# Patient Record
Sex: Male | Born: 1975 | Race: White | Hispanic: No | Marital: Married | Smoking: Former smoker
Health system: Southern US, Community
[De-identification: ages and names within clinical notes are randomized; demographics above are authoritative.]

---

## 2014-03-06 ENCOUNTER — Emergency Department
Admission: EM | Admit: 2014-03-06 | Discharge: 2014-03-06 | Discharge status: Absent without leave | Payer: Commercial Managed Care - PPO | Source: Home / Self Care

## 2014-03-06 MED ORDER — INFLUENZA VAC SPLIT QUAD 0.5 ML IM SUSY
0.5000 mL | PREFILLED_SYRINGE | Freq: Once | INTRAMUSCULAR | Status: AC
  Administered 2014-03-06: 0.5 mL via INTRAMUSCULAR

## 2014-03-06 NOTE — ED Notes (Signed)
Patient changed mind, left before administered

## 2014-03-07 ENCOUNTER — Encounter: Payer: Self-pay | Admitting: *Deleted

## 2014-03-07 ENCOUNTER — Emergency Department
Admission: EM | Admit: 2014-03-07 | Discharge: 2014-03-07 | Disposition: A | Discharge status: Home / Self Care | Payer: Commercial Managed Care - PPO | Source: Home / Self Care

## 2014-03-07 DIAGNOSIS — Z23 Encounter for immunization: Secondary | ICD-10-CM

## 2014-03-07 MED ORDER — INFLUENZA VAC SPLIT QUAD 0.5 ML IM SUSY
0.5000 mL | PREFILLED_SYRINGE | Freq: Once | INTRAMUSCULAR | Status: AC
  Administered 2014-03-07: 0.5 mL via INTRAMUSCULAR

## 2014-03-07 NOTE — ED Notes (Signed)
Flu vac to left deltiod

## 2019-09-04 ENCOUNTER — Emergency Department (INDEPENDENT_AMBULATORY_CARE_PROVIDER_SITE_OTHER): Payer: 59

## 2019-09-04 ENCOUNTER — Emergency Department
Admission: EM | Admit: 2019-09-04 | Discharge: 2019-09-04 | Disposition: A | Discharge status: Home / Self Care | Payer: 59 | Source: Home / Self Care

## 2019-09-04 ENCOUNTER — Other Ambulatory Visit: Payer: Self-pay

## 2019-09-04 DIAGNOSIS — M7711 Lateral epicondylitis, right elbow: Secondary | ICD-10-CM | POA: Diagnosis not present

## 2019-09-04 DIAGNOSIS — M25521 Pain in right elbow: Secondary | ICD-10-CM

## 2019-09-04 MED ORDER — MELOXICAM 15 MG PO TABS: 15.0000 mg | ORAL_TABLET | Freq: Every day | ORAL | 0 refills | Status: DC

## 2019-09-04 MED ORDER — ACETAMINOPHEN 325 MG PO TABS
650.0000 mg | ORAL_TABLET | Freq: Once | ORAL | Status: AC
  Administered 2019-09-04: 650 mg via ORAL

## 2019-09-04 NOTE — ED Triage Notes (Signed)
Patient presents to Urgent Care with complaints of right elbow pain that has gotten progressively worse since about 4 weeks ago. Patient reports no known injury, has been applying ice at night, pain is dull and radiates down his arm towards his hand.

## 2019-09-04 NOTE — ED Provider Notes (Signed)
[facility]    CSN: [id number] Arrival date & time: 09/04/19  1441      History   Chief Complaint Chief Complaint  Patient presents with  . Elbow Pain    HPI Harold Thomas is a 44 y.o. male.   HPI  Harold Thomas is a 44 y.o. male presenting to UC with c/o Right elbow pain that started about 4 weeks ago, intermittent, sharp and aching with certain movements. He has applied ice and taken Aleve at times. 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 He is Right hand dominant. Pain is 7/10 at worst.    History reviewed. No pertinent past medical history.  There are no problems to display for this patient.   History reviewed. No pertinent surgical history.     Home Medications    Prior to Admission medications   Medication Sig Start Date End Date Taking? Authorizing Provider  meloxicam (MOBIC) 15 MG tablet Take 1 tablet (15 mg total) by mouth daily. 09/04/19   Lurene Shadow, PA-C    Family History Family History  Problem Relation Age of Onset  . Cancer Mother   . Diabetes Father   . Cancer Father     Social History Social History   Tobacco Use  . Smoking status: Former Games developer  . Smokeless tobacco: Never Used  Substance Use Topics  . Alcohol use: Yes    Comment: occ  . Drug use: No     Allergies   Penicillins   Review of Systems Review of Systems  Musculoskeletal: Positive for arthralgias. Negative for back pain, joint swelling and neck pain.  Skin: Negative for color change and wound.  Neurological: Positive for weakness. Negative for numbness.     Physical Exam Triage Vital Signs ED Triage Vitals  Enc Vitals Group     BP 09/04/19 1453 124/82     Pulse Rate 09/04/19 1453 95     Resp 09/04/19 1453 18     Temp 09/04/19 1453 99.4 F (37.4 C)     Temp Source 09/04/19 1453 Oral     SpO2 09/04/19 1453 96 %     Weight --      Height --      Head Circumference --      Peak Flow --      Pain Score  09/04/19 1452 7     Pain Loc --      Pain Edu? --      Excl. in GC? --    No data found.  Updated Vital Signs BP 124/82 (BP Location: Left Arm)   Pulse 95   Temp 99.4 F (37.4 C) (Oral)   Resp 18   SpO2 96%   Visual Acuity Right Eye Distance:   Left Eye Distance:   Bilateral Distance:    Right Eye Near:   Left Eye Near:    Bilateral Near:     Physical Exam Vitals and nursing note reviewed.  Constitutional:      Appearance: Normal appearance. He is well-developed.  HENT:     Head: Normocephalic and atraumatic.  Cardiovascular:     Rate and Rhythm: Normal rate and regular rhythm.     Pulses:          Radial pulses are 2+ on the right side.  Pulmonary:     Effort: Pulmonary effort is normal.  Musculoskeletal:        General: Tenderness present. No swelling.  Normal range of motion.     Cervical back: Normal range of motion.     Comments: Right elbow: tenderness along lateral aspect. Full ROM. Increased pain with pronation and supination against resistance. 4/5 grip strength compared to Left. No tenderness of shoulder, wrist or hand.  No spinal tenderness.   Skin:    General: Skin is warm and dry.     Capillary Refill: Capillary refill takes less than 2 seconds.     Findings: No bruising or erythema.  Neurological:     Mental Status: He is alert and oriented to person, place, and time.     Sensory: No sensory deficit.  Psychiatric:        Behavior: Behavior normal.      UC Treatments / Results  Labs (all labs ordered are listed, but only abnormal results are displayed) Labs Reviewed - No data to display  EKG   Radiology DG ELBOW COMPLETE RIGHT (3+VIEW)  Result Date: 09/04/2019 CLINICAL DATA:  Elbow pain for 4 weeks EXAM: RIGHT ELBOW - COMPLETE 3+ VIEW COMPARISON:  None. FINDINGS: There is no evidence of fracture, dislocation, or joint effusion. There is no evidence of arthropathy or other focal bone abnormality. Soft tissues are unremarkable. IMPRESSION:  Negative. Electronically Signed   By: Jasmine Pang M.D.   On: 09/04/2019 15:44    Procedures Procedures (including critical care time)  Medications Ordered in UC Medications  acetaminophen (TYLENOL) tablet 650 mg (650 mg Oral Given 09/04/19 1458)    Initial Impression / Assessment and Plan / UC Course  I have reviewed the triage vital signs and the nursing notes.  Pertinent labs & imaging results that were available during my care of the patient were reviewed by me and considered in my medical decision making (see chart for details).     Reviewed imaging with pt Will tx with elbow strap for lateral epicondylitis Rx: meloxicam  F/u Sports Medicine AVS given  Final Clinical Impressions(s) / UC Diagnoses   Final diagnoses:  Right elbow pain  Right lateral epicondylitis     Discharge Instructions      Meloxicam (Mobic) is an antiinflammatory to help with pain and inflammation.  Do not take ibuprofen, Advil, Aleve, or any other medications that contain NSAIDs while taking meloxicam as this may cause stomach upset or even ulcers if taken in large amounts for an extended period of time.   Please call to schedule a follow up appointment with Sports Medicine if not improving in 1 week.    ED Prescriptions    Medication Sig Dispense Auth. Provider   meloxicam (MOBIC) 15 MG tablet Take 1 tablet (15 mg total) by mouth daily. 30 tablet Lurene Shadow, [place]     PDMP not reviewed this encounter.   Lurene Shadow, [place] 09/04/19 1652

## 2019-09-04 NOTE — Discharge Instructions (Signed)
°  Meloxicam (Mobic) is an antiinflammatory to help with pain and inflammation.  Do not take ibuprofen, Advil, Aleve, or any other medications that contain NSAIDs while taking meloxicam as this may cause stomach upset or even ulcers if taken in large amounts for an extended period of time.   Please call to schedule a follow up appointment with Sports Medicine if not improving in 1 week.

## 2020-03-30 ENCOUNTER — Encounter: Payer: Self-pay | Admitting: Emergency Medicine

## 2020-03-30 ENCOUNTER — Emergency Department
Admission: EM | Admit: 2020-03-30 | Discharge: 2020-03-30 | Disposition: A | Discharge status: Home / Self Care | Payer: 59 | Source: Home / Self Care

## 2020-03-30 ENCOUNTER — Other Ambulatory Visit: Payer: Self-pay

## 2020-03-30 DIAGNOSIS — R053 Chronic cough: Secondary | ICD-10-CM

## 2020-03-30 DIAGNOSIS — R0982 Postnasal drip: Secondary | ICD-10-CM

## 2020-03-30 DIAGNOSIS — J069 Acute upper respiratory infection, unspecified: Secondary | ICD-10-CM

## 2020-03-30 MED ORDER — BENZONATATE 100 MG PO CAPS: 100.0000 mg | ORAL_CAPSULE | Freq: Three times a day (TID) | ORAL | 0 refills | Status: DC

## 2020-03-30 MED ORDER — AZITHROMYCIN 250 MG PO TABS: 250.0000 mg | ORAL_TABLET | Freq: Every day | ORAL | 0 refills | Status: DC

## 2020-03-30 MED ORDER — IPRATROPIUM BROMIDE 0.06 % NA SOLN: 2.0000 | Freq: Four times a day (QID) | NASAL | 1 refills | Status: DC

## 2020-03-30 NOTE — Discharge Instructions (Signed)
  Please take antibiotics as prescribed and be sure to complete entire course even if you start to feel better to ensure infection does not come back.  Call to schedule a follow up appointment with primary care if not improving in 1 week.

## 2020-03-30 NOTE — ED Provider Notes (Signed)
[facility]    CSN: [id number] Arrival date & time: 03/30/20  1210      History   Chief Complaint Chief Complaint  Patient presents with  . Cough    HPI Harold Thomas is a 45 y.o. male.   HPI  Harold Thomas is a 45 y.o. male presenting to UC with c/o continued dry cough after being dx with COVID 5-6 weeks ago.  Associated nasal congestion and drainage at night. Denies fever, chills, n/v/d. No chest pain or SOB.    History reviewed. No pertinent past medical history.  There are no problems to display for this patient.   History reviewed. No pertinent surgical history.     Home Medications    Prior to Admission medications   Medication Sig Start Date End Date Taking? Authorizing Provider  azithromycin (ZITHROMAX) 250 MG tablet Take 1 tablet (250 mg total) by mouth daily. Take first 2 tablets together, then 1 every day until finished. 03/30/20  Yes Dyami Umbach O, PA-C  benzonatate (TESSALON) 100 MG capsule Take 1 capsule (100 mg total) by mouth every 8 (eight) hours. 03/30/20  Yes Leola Fiore O, PA-C  ipratropium (ATROVENT) 0.06 % nasal spray Place 2 sprays into both nostrils 4 (four) times daily. 03/30/20  Yes Delilah Mulgrew, Vangie Bicker, PA-C  meloxicam (MOBIC) 15 MG tablet Take 1 tablet (15 mg total) by mouth daily. 09/04/19   Lurene Shadow, PA-C    Family History Family History  Problem Relation Age of Onset  . Cancer Mother   . Diabetes Father   . Cancer Father     Social History Social History   Tobacco Use  . Smoking status: Former Games developer  . Smokeless tobacco: Never Used  Substance Use Topics  . Alcohol use: Yes    Comment: occ  . Drug use: No     Allergies   Penicillins   Review of Systems Review of Systems  Constitutional: Negative for chills and fever.  HENT: Positive for congestion, postnasal drip and sinus pressure. Negative for ear pain, sore throat, trouble swallowing and voice change.   Respiratory: Positive for cough. Negative for  shortness of breath.   Cardiovascular: Negative for chest pain and palpitations.  Gastrointestinal: Negative for abdominal pain, diarrhea, nausea and vomiting.  Musculoskeletal: Negative for arthralgias, back pain and myalgias.  Skin: Negative for rash.  Neurological: Positive for headaches. Negative for dizziness and light-headedness.  All other systems reviewed and are negative.    Physical Exam Triage Vital Signs ED Triage Vitals  Enc Vitals Group     BP 03/30/20 1235 (!) 160/89     Pulse Rate 03/30/20 1235 90     Resp --      Temp 03/30/20 1235 99 F (37.2 C)     Temp Source 03/30/20 1235 Oral     SpO2 03/30/20 1235 96 %     Weight --      Height --      Head Circumference --      Peak Flow --      Pain Score 03/30/20 1236 0     Pain Loc --      Pain Edu? --      Excl. in GC? --    No data found.  Updated Vital Signs BP (!) 160/89 (BP Location: Left Arm)   Pulse 90   Temp 99 F (37.2 C) (Oral)   SpO2 96%   Visual Acuity Right Eye Distance:   Left Eye Distance:  Bilateral Distance:    Right Eye Near:   Left Eye Near:    Bilateral Near:     Physical Exam Vitals and nursing note reviewed.  Constitutional:      General: He is not in acute distress.    Appearance: Normal appearance. He is well-developed and well-nourished. He is not ill-appearing, toxic-appearing or diaphoretic.  HENT:     Head: Normocephalic and atraumatic.     Right Ear: Tympanic membrane and ear canal normal.     Left Ear: Tympanic membrane and ear canal normal.     Nose: Congestion present.     Right Sinus: No maxillary sinus tenderness or frontal sinus tenderness.     Left Sinus: No maxillary sinus tenderness or frontal sinus tenderness.     Mouth/Throat:     Lips: Pink.     Mouth: Mucous membranes are moist.     Pharynx: Oropharynx is clear. Uvula midline. No pharyngeal swelling, oropharyngeal exudate, posterior oropharyngeal erythema or uvula swelling.  Eyes:     General: No  scleral icterus.    Conjunctiva/sclera: Conjunctivae normal.  Cardiovascular:     Rate and Rhythm: Normal rate and regular rhythm.     Heart sounds: Normal heart sounds.  Pulmonary:     Effort: Pulmonary effort is normal. No respiratory distress.     Breath sounds: Normal breath sounds. No stridor. No wheezing, rhonchi or rales.  Chest:     Chest wall: No tenderness.  Abdominal:     General: Bowel sounds are normal. There is no distension.     Palpations: Abdomen is soft. There is no mass.     Tenderness: There is no abdominal tenderness. There is no guarding or rebound.  Musculoskeletal:        General: Normal range of motion.     Cervical back: Normal range of motion and neck supple. No tenderness.  Lymphadenopathy:     Cervical: No cervical adenopathy.  Skin:    General: Skin is warm and dry.  Neurological:     Mental Status: He is alert.      UC Treatments / Results  Labs (all labs ordered are listed, but only abnormal results are displayed) Labs Reviewed - No data to display  EKG   Radiology No results found.  Procedures Procedures (including critical care time)  Medications Ordered in UC Medications - No data to display  Initial Impression / Assessment and Plan / UC Course  I have reviewed the triage vital signs and the nursing notes.  Pertinent labs & imaging results that were available during my care of the patient were reviewed by me and considered in my medical decision making (see chart for details).    Lungs: CTAB, however, due to persistence of symptoms, will cover for atypical bacterial infection Encouraged f/u with PCP as needed  Final Clinical Impressions(s) / UC Diagnoses   Final diagnoses:  Upper respiratory tract infection, unspecified type  Persistent cough for 3 weeks or longer  Post-nasal drainage     Discharge Instructions      Please take antibiotics as prescribed and be sure to complete entire course even if you start to feel  better to ensure infection does not come back.  Call to schedule a follow up appointment with primary care if not improving in 1 week.    ED Prescriptions    Medication Sig Dispense Auth. Provider   azithromycin (ZITHROMAX) 250 MG tablet Take 1 tablet (250 mg total) by mouth daily. Take first  2 tablets together, then 1 every day until finished. 6 tablet Waylan Rocher O, PA-C   ipratropium (ATROVENT) 0.06 % nasal spray Place 2 sprays into both nostrils 4 (four) times daily. 15 mL Niurka Benecke O, PA-C   benzonatate (TESSALON) 100 MG capsule Take 1 capsule (100 mg total) by mouth every 8 (eight) hours. 21 capsule Lurene Shadow, [place]     PDMP not reviewed this encounter.   Lurene Shadow, [place] 03/31/20 1337

## 2020-03-30 NOTE — ED Triage Notes (Signed)
Patient was diagnosed with COVID 5-6 weeks ago, still having a dry cough, head congestion, drainage at night.  No cough at night.  Patient has taken Delsym, Advil sinus.  Patient is vaccinated.

## 2021-02-11 ENCOUNTER — Other Ambulatory Visit: Payer: Self-pay

## 2021-02-11 ENCOUNTER — Encounter: Payer: Self-pay | Admitting: Emergency Medicine

## 2021-02-11 ENCOUNTER — Emergency Department (INDEPENDENT_AMBULATORY_CARE_PROVIDER_SITE_OTHER)
Admission: EM | Admit: 2021-02-11 | Discharge: 2021-02-11 | Disposition: A | Discharge status: Home / Self Care | Payer: 59 | Source: Home / Self Care | Attending: Family Medicine | Admitting: Family Medicine

## 2021-02-11 DIAGNOSIS — R051 Acute cough: Secondary | ICD-10-CM | POA: Diagnosis not present

## 2021-02-11 DIAGNOSIS — J101 Influenza due to other identified influenza virus with other respiratory manifestations: Secondary | ICD-10-CM

## 2021-02-11 LAB — POCT INFLUENZA A/B
Influenza A, POC: POSITIVE — AB
Influenza B, POC: NEGATIVE

## 2021-02-11 MED ORDER — PROMETHAZINE-DM 6.25-15 MG/5ML PO SYRP: 5.0000 mL | ORAL_SOLUTION | Freq: Four times a day (QID) | ORAL | 0 refills | Status: AC | PRN

## 2021-02-11 MED ORDER — OSELTAMIVIR PHOSPHATE 75 MG PO CAPS: 75.0000 mg | ORAL_CAPSULE | Freq: Two times a day (BID) | ORAL | 0 refills | Status: AC

## 2021-02-11 NOTE — ED Triage Notes (Signed)
Cough x 2 days  Nasal congestion  Body aches Sudafed & dayquil  No flu vaccine

## 2021-02-11 NOTE — ED Provider Notes (Signed)
[facility]    CSN: [id number] Arrival date & time: 02/11/21  0913      History   Chief Complaint Chief Complaint  Patient presents with   Cough    HPI Harold Thomas is a 45 y.o. male.   HPI  Patient has a sudden onset of headaches, body aches, nasal congestion, harsh cough with chest pain.  Feels very tired.  He did not get a flu vaccine.  He has been using DayQuil and Sudafed.  He thinks he may have the flu.  No known exposure to influenza, COVID, strep.  Symptoms have been persisting for 2 days  History reviewed. No pertinent past medical history.  There are no problems to display for this patient.   History reviewed. No pertinent surgical history.     Home Medications    Prior to Admission medications   Medication Sig Start Date End Date Taking? Authorizing Provider  oseltamivir (TAMIFLU) 75 MG capsule Take 1 capsule (75 mg total) by mouth every 12 (twelve) hours. 02/11/21  Yes Eustace Moore, MD  promethazine-dextromethorphan (PROMETHAZINE-DM) 6.25-15 MG/5ML syrup Take 5 mLs by mouth 4 (four) times daily as needed for cough. 02/11/21  Yes Eustace Moore, MD    Family History Family History  Problem Relation Age of Onset   Cancer Mother    Diabetes Father    Cancer Father     Social History Social History   Tobacco Use   Smoking status: Former   Smokeless tobacco: Never  Building services engineer Use: Never used  Substance Use Topics   Alcohol use: Yes    Comment: occ   Drug use: No     Allergies   Penicillins   Review of Systems Review of Systems  See HPI Physical Exam Triage Vital Signs ED Triage Vitals  Enc Vitals Group     BP 02/11/21 1022 (!) 164/106     Pulse Rate 02/11/21 1022 87     Resp 02/11/21 1022 18     Temp 02/11/21 1022 99.3 F (37.4 C)     Temp Source 02/11/21 1022 Oral     SpO2 02/11/21 1022 99 %     Weight 02/11/21 1010 270 lb (122.5 kg)     Height 02/11/21 1010 6\' 3"  (1.905 m)     Head  Circumference --      Peak Flow --      Pain Score 02/11/21 1009 2     Pain Loc --      Pain Edu? --      Excl. in GC? --    No data found.  Updated Vital Signs BP 136/84 (BP Location: Left Arm)   Pulse 87   Temp 99.3 F (37.4 C) (Oral)   Resp 18   Ht 6\' 3"  (1.905 m)   Wt 122.5 kg   SpO2 99%   BMI 33.75 kg/m      Physical Exam Constitutional:      General: He is not in acute distress.    Appearance: He is well-developed and normal weight. He is ill-appearing.  HENT:     Head: Normocephalic and atraumatic.     Right Ear: Tympanic membrane and ear canal normal.     Left Ear: Tympanic membrane and ear canal normal.     Nose: Congestion present.     Mouth/Throat:     Pharynx: Posterior oropharyngeal erythema present.  Eyes:     Conjunctiva/sclera: Conjunctivae normal.     Pupils:  Pupils are equal, round, and reactive to light.  Cardiovascular:     Rate and Rhythm: Normal rate and regular rhythm.     Heart sounds: Normal heart sounds.  Pulmonary:     Effort: Pulmonary effort is normal. No respiratory distress.     Breath sounds: No wheezing or rhonchi.  Abdominal:     General: There is no distension.     Palpations: Abdomen is soft.  Musculoskeletal:        General: Normal range of motion.     Cervical back: Normal range of motion.  Lymphadenopathy:     Cervical: Cervical adenopathy present.  Skin:    General: Skin is warm and dry.  Neurological:     Mental Status: He is alert.  Psychiatric:        Mood and Affect: Mood normal.        Behavior: Behavior normal.     UC Treatments / Results  Labs (all labs ordered are listed, but only abnormal results are displayed) Labs Reviewed  POCT INFLUENZA A/B - Abnormal; Notable for the following components:      Result Value   Influenza A, POC Positive (*)    All other components within normal limits    EKG   Radiology No results found.  Procedures Procedures (including critical care time)  Medications  Ordered in UC Medications - No data to display  Initial Impression / Assessment and Plan / UC Course  I have reviewed the triage vital signs and the nursing notes.  Pertinent labs & imaging results that were available during my care of the patient were reviewed by me and considered in my medical decision making (see chart for details).      Influenza test is positive.  Discussed care.  Patient desires Tamiflu.  His wife is a Engineer, civil (consulting).  Offered Zofran.  He states he is not having nausea.  Phenergan DM for cough  Final diagnoses:  Acute cough  Influenza A     Discharge Instructions      Drink lots of fluids  take over-the-counter cough and cold medicines as needed Take ibuprofen or acetaminophen for pain and fever  Take Tamiflu 2 times a day for 5 days.  Take 2 doses today.  Call for problems   ED Prescriptions     Medication Sig Dispense Auth. Provider   oseltamivir (TAMIFLU) 75 MG capsule Take 1 capsule (75 mg total) by mouth every 12 (twelve) hours. 10 capsule Eustace Moore, MD   promethazine-dextromethorphan (PROMETHAZINE-DM) 6.25-15 MG/5ML syrup Take 5 mLs by mouth 4 (four) times daily as needed for cough. 180 mL Eustace Moore, MD      PDMP not reviewed this encounter.   Eustace Moore, MD 02/11/21 [id number]

## 2021-02-11 NOTE — Discharge Instructions (Addendum)
Drink lots of fluids  take over-the-counter cough and cold medicines as needed Take ibuprofen or acetaminophen for pain and fever  Take Tamiflu 2 times a day for 5 days.  Take 2 doses today.  Call for problems

## 2021-12-17 IMAGING — DX DG ELBOW COMPLETE 3+V*R*
4 series · 4 of 4 positions shown · non-contrast
Comparison: None.

CLINICAL DATA: Elbow pain for 4 weeks

EXAM:
RIGHT ELBOW - COMPLETE 3+ VIEW

[elbow ap]
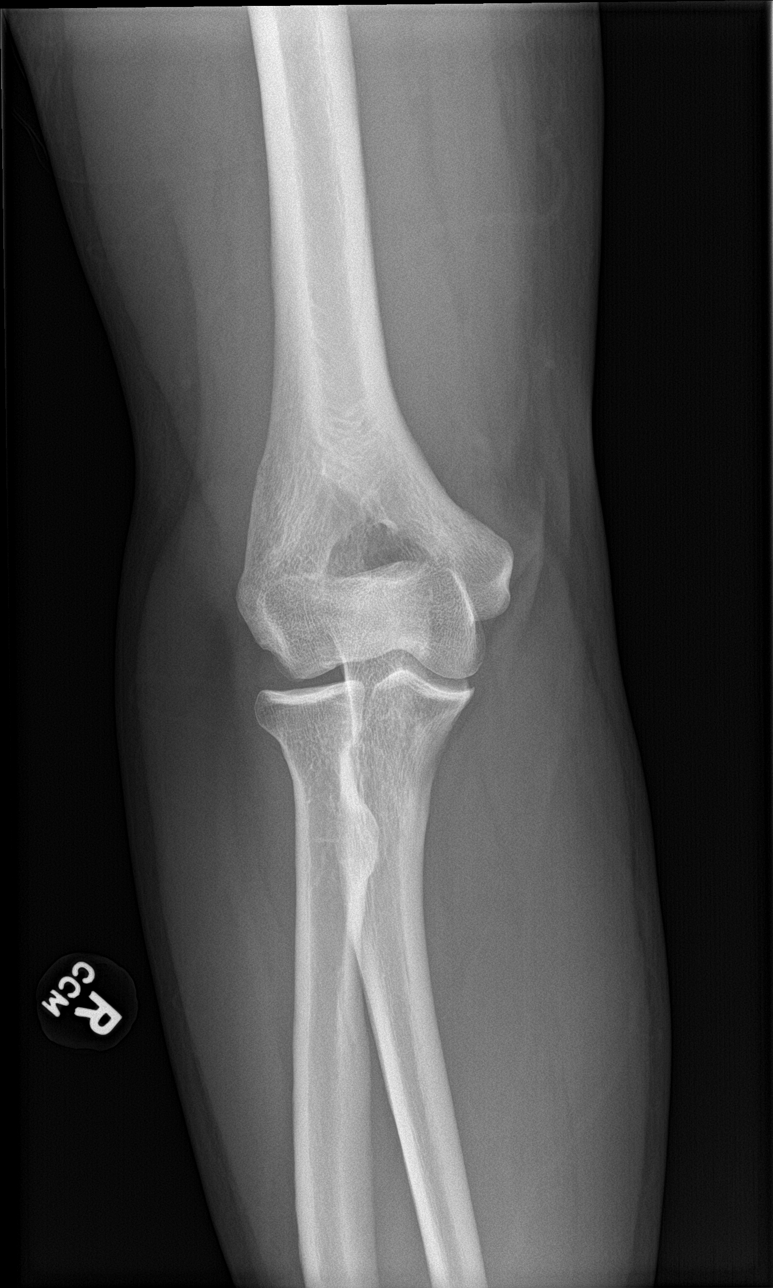

[elbow obl (1 of 2)]
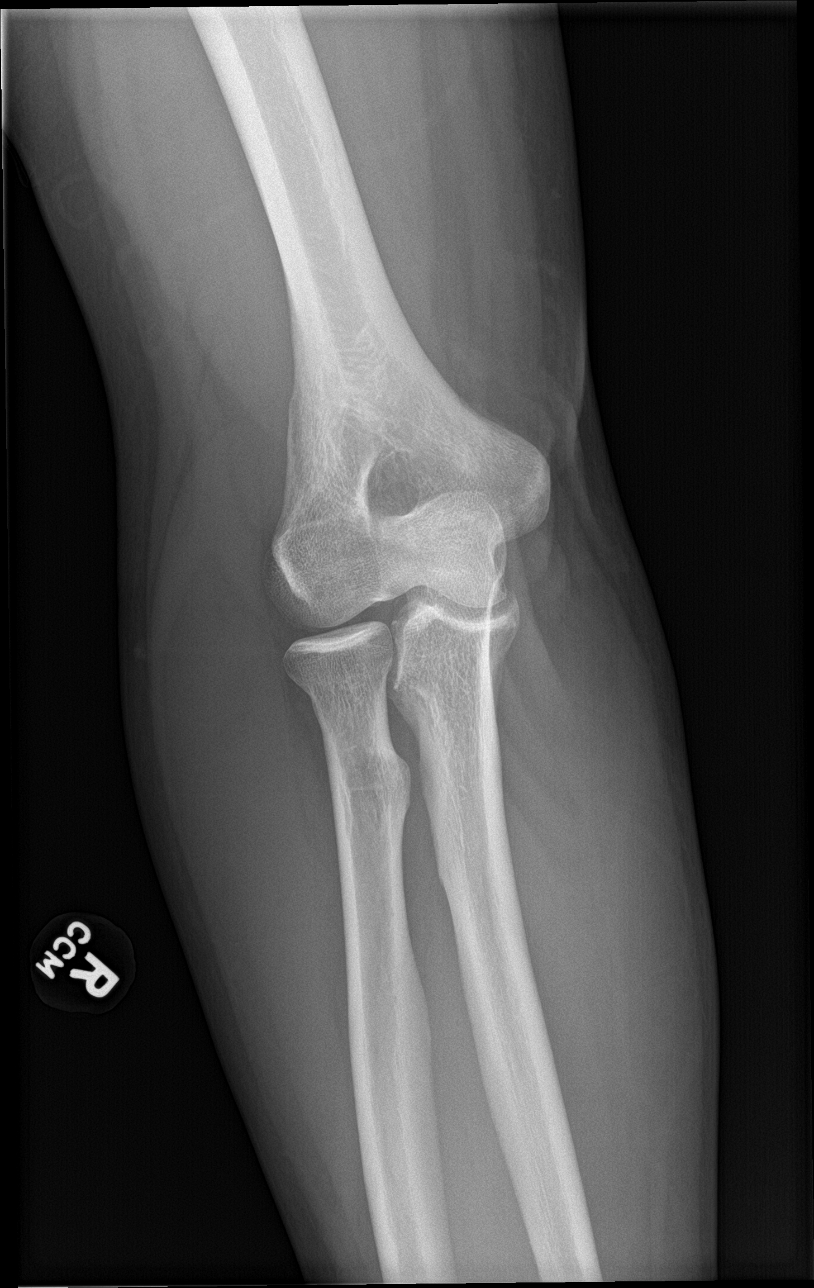

[elbow obl (2 of 2)]
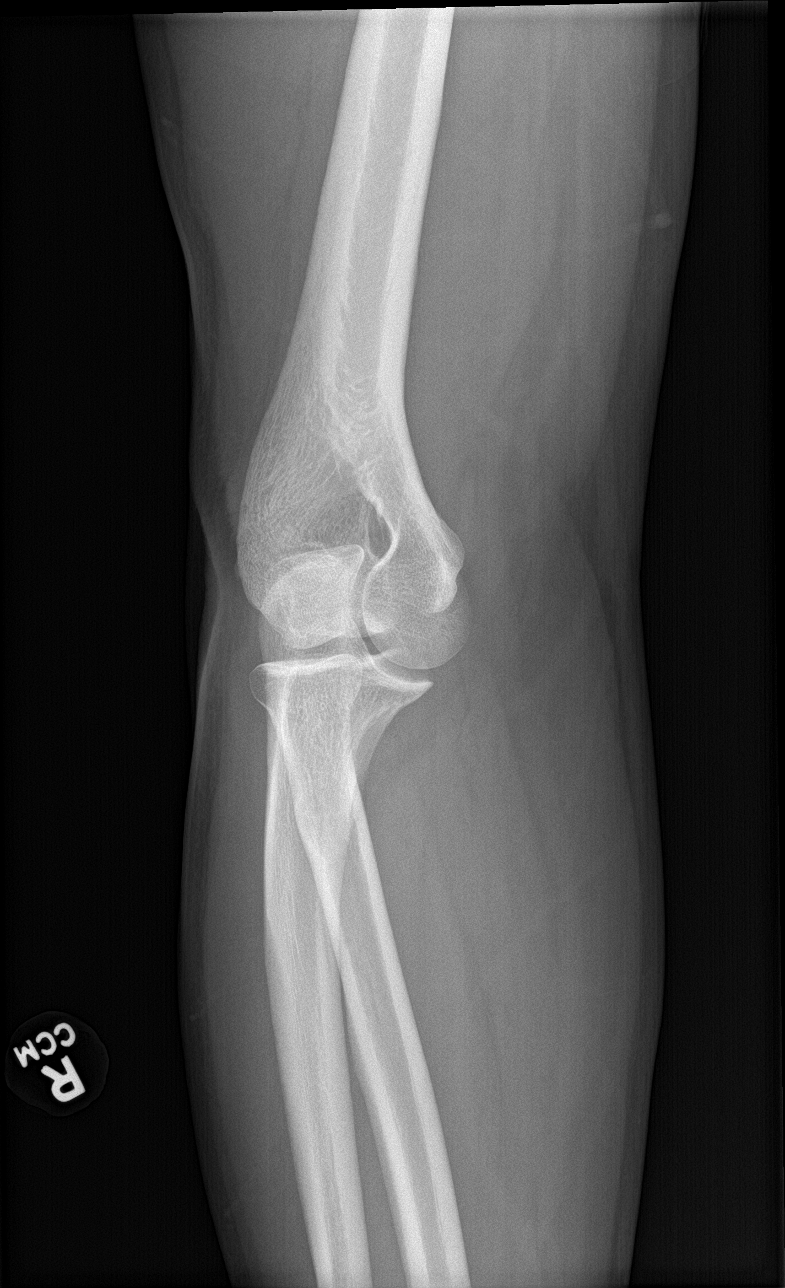

[elbow lat]
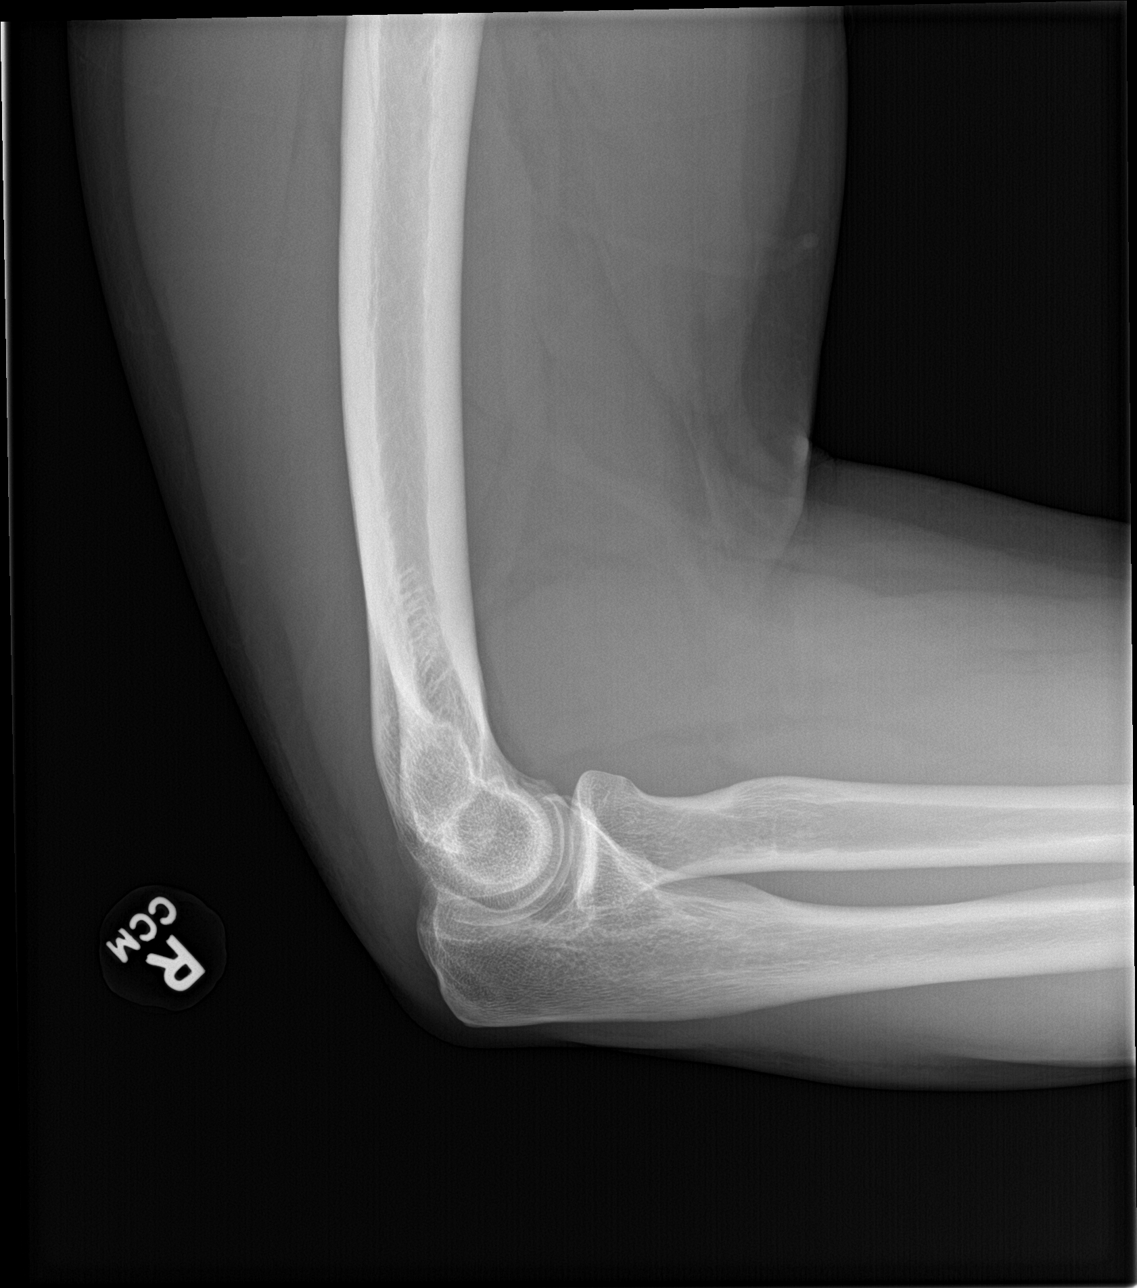

[4 of 4 positions shown; findings below may reference images not displayed]

FINDINGS: There is no evidence of fracture, dislocation, or joint effusion.
There is no evidence of arthropathy or other focal bone abnormality.
Soft tissues are unremarkable.
IMPRESSION: Negative.
